# Patient Record
Sex: Male | Born: 1975 | Race: White | Hispanic: No | Marital: Married | Smoking: Current every day smoker
Health system: Southern US, Community
[De-identification: ages and names within clinical notes are randomized; demographics above are authoritative.]

---

## 2008-07-24 ENCOUNTER — Emergency Department (HOSPITAL_COMMUNITY): Admission: EM | Admit: 2008-07-24 | Discharge: 2008-07-24 | Payer: Self-pay | Admitting: Emergency Medicine

## 2011-08-06 LAB — URINALYSIS, ROUTINE W REFLEX MICROSCOPIC
Bilirubin Urine: NEGATIVE
Glucose, UA: NEGATIVE
Hgb urine dipstick: NEGATIVE
Protein, ur: NEGATIVE
Urobilinogen, UA: 0.2

## 2018-11-30 ENCOUNTER — Ambulatory Visit (HOSPITAL_COMMUNITY)
Admission: EM | Admit: 2018-11-30 | Discharge: 2018-11-30 | Disposition: A | Discharge status: Home / Self Care | Payer: Self-pay | Attending: Physician Assistant | Admitting: Physician Assistant

## 2018-11-30 ENCOUNTER — Encounter (HOSPITAL_COMMUNITY): Payer: Self-pay | Admitting: *Deleted

## 2018-11-30 DIAGNOSIS — Z23 Encounter for immunization: Secondary | ICD-10-CM

## 2018-11-30 DIAGNOSIS — S41111A Laceration without foreign body of right upper arm, initial encounter: Secondary | ICD-10-CM | POA: Insufficient documentation

## 2018-11-30 MED ORDER — TETANUS-DIPHTH-ACELL PERTUSSIS 5-2.5-18.5 LF-MCG/0.5 IM SUSP
0.5000 mL | Freq: Once | INTRAMUSCULAR | Status: AC
  Administered 2018-11-30: 0.5 mL via INTRAMUSCULAR

## 2018-11-30 MED ORDER — TETANUS-DIPHTH-ACELL PERTUSSIS 5-2.5-18.5 LF-MCG/0.5 IM SUSP
INTRAMUSCULAR | Status: AC
  Filled 2018-11-30: qty 0.5

## 2018-11-30 MED ORDER — CEPHALEXIN 500 MG PO CAPS: 500.0000 mg | ORAL_CAPSULE | Freq: Two times a day (BID) | ORAL | 0 refills | Status: AC

## 2018-11-30 NOTE — ED Triage Notes (Signed)
Assessment per M. Chestine Spore, PA.
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
## 2018-11-30 NOTE — Discharge Instructions (Signed)
°  Start the antibiotic tonight.  Come back in 10 days for suture removal.

## 2018-12-10 ENCOUNTER — Ambulatory Visit (HOSPITAL_COMMUNITY)
Admission: EM | Admit: 2018-12-10 | Discharge: 2018-12-10 | Disposition: A | Discharge status: Home / Self Care | Payer: Self-pay

## 2018-12-10 ENCOUNTER — Encounter (HOSPITAL_COMMUNITY): Payer: Self-pay | Admitting: Emergency Medicine

## 2018-12-10 DIAGNOSIS — Z4802 Encounter for removal of sutures: Secondary | ICD-10-CM

## 2018-12-10 NOTE — ED Triage Notes (Signed)
Pt here for suture removal to right forearm; 5 sutures removed and wound intact

## 2020-06-02 ENCOUNTER — Ambulatory Visit (HOSPITAL_COMMUNITY): Payer: Self-pay

## 2020-06-13 ENCOUNTER — Ambulatory Visit (HOSPITAL_COMMUNITY): Payer: Self-pay

## 2021-03-26 ENCOUNTER — Emergency Department (HOSPITAL_COMMUNITY): Payer: Self-pay

## 2021-03-26 ENCOUNTER — Emergency Department (HOSPITAL_COMMUNITY)
Admission: EM | Admit: 2021-03-26 | Discharge: 2021-03-27 | Discharge status: Against Medical Advice | Payer: Self-pay | Attending: Student | Admitting: Student

## 2021-03-26 ENCOUNTER — Other Ambulatory Visit: Payer: Self-pay

## 2021-03-26 ENCOUNTER — Encounter (HOSPITAL_COMMUNITY): Payer: Self-pay

## 2021-03-26 DIAGNOSIS — X58XXXA Exposure to other specified factors, initial encounter: Secondary | ICD-10-CM | POA: Insufficient documentation

## 2021-03-26 DIAGNOSIS — Z5321 Procedure and treatment not carried out due to patient leaving prior to being seen by health care provider: Secondary | ICD-10-CM | POA: Insufficient documentation

## 2021-03-26 DIAGNOSIS — S60351A Superficial foreign body of right thumb, initial encounter: Secondary | ICD-10-CM | POA: Insufficient documentation

## 2021-03-26 MED ORDER — TETANUS-DIPHTH-ACELL PERTUSSIS 5-2.5-18.5 LF-MCG/0.5 IM SUSY: 0.5000 mL | PREFILLED_SYRINGE | Freq: Once | INTRAMUSCULAR | Status: DC

## 2021-03-26 NOTE — ED Notes (Signed)
Called for repeat VS x3, no response. 

## 2021-03-26 NOTE — ED Triage Notes (Signed)
Fish hook stuck on right thumb and unable to take it out. Unknown tetanus.

## 2021-03-26 NOTE — ED Provider Notes (Signed)
Emergency Medicine Provider Triage Evaluation Note  Bradley Alexander , a 45 y.o. male  was evaluated in triage.  Pt complains of right thumb injury. Patient has a hook stuck in right thumb and unable to remove. Unsure when his last tetanus shot was. He attempted to pull it out without any luck. Patient is right hand dominant.  Review of Systems  Positive: wound Negative: fever  Physical Exam  BP (!) 135/91 (BP Location: Left Arm)   Pulse (!) 101   Temp 98.1 F (36.7 C) (Oral)   Ht 5\' 6"  (1.676 m)   Wt 65.8 kg   SpO2 97%   BMI 23.40 kg/m  Gen:   Awake, no distress   Resp:  Normal effort  MSK:   Moves extremities without difficulty  Other:  Hook stuck in right thumb  Medical Decision Making  Medically screening exam initiated at 9:40 PM.  Appropriate orders placed.  Bradley Alexander was informed that the remainder of the evaluation will be completed by another provider, this initial triage assessment does not replace that evaluation, and the importance of remaining in the ED until their evaluation is complete.  X-ray ordered.    Iva Lento 03/26/21 2142    2143, MD 03/27/21 [phone]

## 2021-03-27 ENCOUNTER — Ambulatory Visit: Payer: Self-pay

## 2021-03-27 ENCOUNTER — Ambulatory Visit (HOSPITAL_COMMUNITY)
Admission: EM | Admit: 2021-03-27 | Discharge: 2021-03-27 | Disposition: A | Discharge status: Home / Self Care | Payer: Self-pay | Attending: Internal Medicine | Admitting: Internal Medicine

## 2021-03-27 ENCOUNTER — Encounter (HOSPITAL_COMMUNITY): Payer: Self-pay

## 2021-03-27 DIAGNOSIS — Z23 Encounter for immunization: Secondary | ICD-10-CM

## 2021-03-27 DIAGNOSIS — S60351A Superficial foreign body of right thumb, initial encounter: Secondary | ICD-10-CM

## 2021-03-27 MED ORDER — MUPIROCIN CALCIUM 2 % EX CREA: 1.0000 "application " | TOPICAL_CREAM | Freq: Two times a day (BID) | CUTANEOUS | 0 refills | Status: AC

## 2021-03-27 MED ORDER — TETANUS-DIPHTH-ACELL PERTUSSIS 5-2.5-18.5 LF-MCG/0.5 IM SUSY
0.5000 mL | PREFILLED_SYRINGE | Freq: Once | INTRAMUSCULAR | Status: AC
  Administered 2021-03-27: 0.5 mL via INTRAMUSCULAR

## 2021-03-27 MED ORDER — TETANUS-DIPHTH-ACELL PERTUSSIS 5-2.5-18.5 LF-MCG/0.5 IM SUSY
PREFILLED_SYRINGE | INTRAMUSCULAR | Status: AC
  Filled 2021-03-27: qty 0.5

## 2021-03-27 MED ORDER — LIDOCAINE HCL 2 % IJ SOLN
INTRAMUSCULAR | Status: AC
  Filled 2021-03-27: qty 20

## 2021-03-27 NOTE — ED Provider Notes (Signed)
[facility]    CSN: [id number] Arrival date & time: 03/27/21  1029      History   Chief Complaint Chief Complaint  Patient presents with  . finger problem    HPI Bradley Alexander is a 45 y.o. male comes to the urgent care with painful swelling of the right thumb of 1 day duration.  [place] in his right thumb.  He had x-ray of the right thumb yesterday in the ED but left before provider could evaluate him.  He comes to the urgent care complaining of worsening pain.  He attempted to remove the embedded fishhook unsuccessfully.  Tdap has not been updated in over 10 years. HPI  History reviewed. No pertinent past medical history.  There are no problems to display for this patient.   History reviewed. No pertinent surgical history.     Home Medications    Prior to Admission medications   Medication Sig Start Date End Date Taking? Authorizing Provider  mupirocin cream (BACTROBAN) 2 % Apply 1 application topically 2 (two) times daily. 03/27/21  Yes Thanvi Blincoe, Britta Mccreedy, MD    Family History Family History  Problem Relation Age of Onset  . Diabetes Father     Social History Social History   Tobacco Use  . Smoking status: Current Every Day Smoker  . Smokeless tobacco: Never Used  Vaping Use  . Vaping Use: Never used  Substance Use Topics  . Alcohol use: Not Currently  . Drug use: Never     Allergies   Patient has no known allergies.   Review of Systems Review of Systems  Gastrointestinal: Negative.   Musculoskeletal: Negative.   Skin: Negative.      Physical Exam Triage Vital Signs ED Triage Vitals  Enc Vitals Group     BP 03/27/21 1143 100/75     Pulse Rate 03/27/21 1143 78     Resp 03/27/21 1143 18     Temp 03/27/21 1143 98.9 F (37.2 C)     Temp Source 03/27/21 1143 Oral     SpO2 03/27/21 1143 98 %     Weight --      Height --      Head Circumference --      Peak Flow --      Pain  Score 03/27/21 1141 10     Pain Loc --      Pain Edu? --      Excl. in GC? --    No data found.  Updated Vital Signs BP 100/75 (BP Location: Right Arm)   Pulse 78   Temp 98.9 F (37.2 C) (Oral)   Resp 18   SpO2 98%   Visual Acuity Right Eye Distance:   Left Eye Distance:   Bilateral Distance:    Right Eye Near:   Left Eye Near:    Bilateral Near:     Physical Exam Vitals and nursing note reviewed.  Constitutional:      General: He is not in acute distress.    Appearance: He is not ill-appearing.  Cardiovascular:     Rate and Rhythm: Normal rate and regular rhythm.  Skin:    Capillary Refill: Capillary refill takes less than 2 seconds.     Comments: Fishhook embedded in the pulp of the right thumb.  Neurological:     Mental Status: He is alert.      UC Treatments / Results  Labs (all labs ordered  are listed, but only abnormal results are displayed) Labs Reviewed - No data to display  EKG   Radiology DG Finger Thumb Right  Result Date: 03/26/2021 CLINICAL DATA:  Fishhook stuck in finger. EXAM: RIGHT THUMB 2+V COMPARISON:  None. FINDINGS: There is no evidence of fracture or dislocation. There is no evidence of arthropathy or other focal bone abnormality. Fishhook appears to be stuck in the soft tissues of the distal right thumb, but it does not appear to be imbedded in the bone. IMPRESSION: No definite fracture or dislocation is noted. Fishhook is in the soft tissues of the distal thumb as described above. Electronically Signed   By: Lupita Raider M.D.   On: 03/26/2021 22:42    Procedures Foreign Body Removal  Date/Time: 03/27/2021 1:39 PM Performed by: Merrilee Jansky, MD Authorized by: Merrilee Jansky, MD   Consent:    Consent obtained:  Verbal   Consent given by:  Patient   Risks, benefits, and alternatives were discussed: yes     Risks discussed:  Bleeding and infection Universal protocol:    Patient identity confirmed:  Verbally with  patient Location:    Location:  Finger   Finger location:  R thumb   Tendon involvement:  None Pre-procedure details:    Imaging:  X-ray   Neurovascular status: intact   Procedure details:    Localization method:  Visualized   Removal mechanism:  Hemostat   Foreign bodies recovered:  1 Post-procedure details:    Neurovascular status: intact     Confirmation:  No additional foreign bodies on visualization   Procedure completion:  Tolerated well, no immediate complications   (including critical care time)  Medications Ordered in UC Medications  Tdap (BOOSTRIX) injection 0.5 mL (0.5 mLs Intramuscular Given 03/27/21 1304)    Initial Impression / Assessment and Plan / UC Course  I have reviewed the triage vital signs and the nursing notes.  Pertinent labs & imaging results that were available during my care of the patient were reviewed by me and considered in my medical decision making (see chart for details).    1.  Foreign body in the skin of the right thumb: Foreign body removed Tdap updated Mupirocin cream twice daily Return precautions given Final Clinical Impressions(s) / UC Diagnoses   Final diagnoses:  Foreign body of skin of thumb, right, initial encounter     Discharge Instructions     Please apply mupirocin cream twice daily for the next 5 days If you notice any swelling or redness please return to the urgent care to be reevaluated.   ED Prescriptions    Medication Sig Dispense Auth. Provider   mupirocin cream (BACTROBAN) 2 % Apply 1 application topically 2 (two) times daily. 15 g Nohealani Medinger, Britta Mccreedy, MD     PDMP not reviewed this encounter.   Merrilee Jansky, MD 03/27/21 1340

## 2021-03-27 NOTE — Discharge Instructions (Signed)
Please apply mupirocin cream twice daily for the next 5 days If you notice any swelling or redness please return to the urgent care to be reevaluated.

## 2021-03-27 NOTE — ED Triage Notes (Signed)
Pt presents with pain and swelling in the right thumb x 1 day. States he has a piece of fish hook in the right thumb.   Pt reports he went to the Ed last night and had Xray done and left after 2 hrs waiting.   Pt reports his last Tdap was 10*15 years ago.

## 2021-04-01 ENCOUNTER — Emergency Department (HOSPITAL_COMMUNITY)
Admission: EM | Admit: 2021-04-01 | Discharge: 2021-04-02 | Disposition: A | Discharge status: Home / Self Care | Payer: Self-pay | Attending: Emergency Medicine | Admitting: Emergency Medicine

## 2021-04-01 ENCOUNTER — Emergency Department (HOSPITAL_COMMUNITY): Payer: Self-pay

## 2021-04-01 ENCOUNTER — Other Ambulatory Visit: Payer: Self-pay

## 2021-04-01 ENCOUNTER — Encounter (HOSPITAL_COMMUNITY): Payer: Self-pay

## 2021-04-01 DIAGNOSIS — S01111A Laceration without foreign body of right eyelid and periocular area, initial encounter: Secondary | ICD-10-CM | POA: Insufficient documentation

## 2021-04-01 DIAGNOSIS — T1490XA Injury, unspecified, initial encounter: Secondary | ICD-10-CM

## 2021-04-01 DIAGNOSIS — M25511 Pain in right shoulder: Secondary | ICD-10-CM | POA: Insufficient documentation

## 2021-04-01 DIAGNOSIS — S060X1A Concussion with loss of consciousness of 30 minutes or less, initial encounter: Secondary | ICD-10-CM | POA: Insufficient documentation

## 2021-04-01 DIAGNOSIS — Y92410 Unspecified street and highway as the place of occurrence of the external cause: Secondary | ICD-10-CM | POA: Insufficient documentation

## 2021-04-01 DIAGNOSIS — F10129 Alcohol abuse with intoxication, unspecified: Secondary | ICD-10-CM | POA: Insufficient documentation

## 2021-04-01 LAB — I-STAT CHEM 8, ED
BUN: 7 mg/dL (ref 6–20)
Calcium, Ion: 1.12 mmol/L — ABNORMAL LOW (ref 1.15–1.40)
Chloride: 100 mmol/L (ref 98–111)
Creatinine, Ser: 0.8 mg/dL (ref 0.61–1.24)
Glucose, Bld: 97 mg/dL (ref 70–99)
HCT: 45 % (ref 39.0–52.0)
Hemoglobin: 15.3 g/dL (ref 13.0–17.0)
Potassium: 3.9 mmol/L (ref 3.5–5.1)
Sodium: 139 mmol/L (ref 135–145)
TCO2: 27 mmol/L (ref 22–32)

## 2021-04-01 LAB — SAMPLE TO BLOOD BANK
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
## 2021-04-01 NOTE — ED Notes (Signed)
Pt not wearing c-collar on arrival due to refusal with EMS. Pt advised to have one placed in ED and pt refused stating he is okay. Pt educated and verbalized understanding. Pt appears stable and is AAOx4.

## 2021-04-01 NOTE — ED Notes (Signed)
Patient transported to CT 

## 2021-04-02 ENCOUNTER — Emergency Department (HOSPITAL_COMMUNITY): Payer: Self-pay

## 2021-04-02 LAB — URINALYSIS, ROUTINE W REFLEX MICROSCOPIC
Bilirubin Urine: NEGATIVE
Glucose, UA: NEGATIVE mg/dL
Hgb urine dipstick: NEGATIVE
Ketones, ur: NEGATIVE mg/dL
Leukocytes,Ua: NEGATIVE
Nitrite: NEGATIVE
Protein, ur: NEGATIVE mg/dL
Specific Gravity, Urine: 1.003 — ABNORMAL LOW (ref 1.005–1.030)
pH: 6 (ref 5.0–8.0)

## 2021-04-02 LAB — COMPREHENSIVE METABOLIC PANEL
ALT: 17 U/L (ref 0–44)
AST: 25 U/L (ref 15–41)
Albumin: 3.8 g/dL (ref 3.5–5.0)
Alkaline Phosphatase: 98 U/L (ref 38–126)
Anion gap: 8 (ref 5–15)
BUN: 7 mg/dL (ref 6–20)
CO2: 27 mmol/L (ref 22–32)
Calcium: 8.6 mg/dL — ABNORMAL LOW (ref 8.9–10.3)
Chloride: 100 mmol/L (ref 98–111)
Creatinine, Ser: 0.75 mg/dL (ref 0.61–1.24)
GFR, Estimated: >60 mL/min (ref >60)
Glucose, Bld: 103 mg/dL — ABNORMAL HIGH (ref 70–99)
Potassium: 3.9 mmol/L (ref 3.5–5.1)
Sodium: 135 mmol/L (ref 135–145)
Total Bilirubin: 0.6 mg/dL (ref 0.3–1.2)
Total Protein: 6.6 g/dL (ref 6.5–8.1)

## 2021-04-02 LAB — CBC
HCT: 43.7 % (ref 39.0–52.0)
Hemoglobin: 14.7 g/dL (ref 13.0–17.0)
MCH: 32.9 pg (ref 26.0–34.0)
MCHC: 33.6 g/dL (ref 30.0–36.0)
MCV: 97.8 fL (ref 80.0–100.0)
Platelets: 246 10*3/uL (ref 150–400)
RBC: 4.47 MIL/uL (ref 4.22–5.81)
RDW: 11.9 % (ref 11.5–15.5)
WBC: 12.2 10*3/uL — ABNORMAL HIGH (ref 4.0–10.5)
nRBC: 0 % (ref 0.0–0.2)

## 2021-04-02 LAB — ETHANOL: Alcohol, Ethyl (B): 212 mg/dL — ABNORMAL HIGH (ref <10)
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
## 2021-04-02 NOTE — ED Notes (Signed)
Pt ambulatory to bathroom wth even and steady gait

## 2021-04-02 NOTE — ED Notes (Signed)
Pt back from the restroom and hooked back up. Pt and visitor expressed feelings of wanting to leave AMA.
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
## 2021-04-04 ENCOUNTER — Encounter (HOSPITAL_COMMUNITY): Payer: Self-pay
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
## 2022-10-07 ENCOUNTER — Other Ambulatory Visit: Payer: Self-pay

## 2022-10-07 ENCOUNTER — Emergency Department (HOSPITAL_COMMUNITY)
Admission: EM | Admit: 2022-10-07 | Discharge: 2022-10-07 | Disposition: A | Discharge status: Home / Self Care | Payer: Self-pay | Attending: Emergency Medicine | Admitting: Emergency Medicine

## 2022-10-07 ENCOUNTER — Encounter (HOSPITAL_COMMUNITY): Payer: Self-pay

## 2022-10-07 ENCOUNTER — Emergency Department (HOSPITAL_COMMUNITY): Payer: Self-pay

## 2022-10-07 DIAGNOSIS — S63259A Unspecified dislocation of unspecified finger, initial encounter: Secondary | ICD-10-CM

## 2022-10-07 DIAGNOSIS — S6992XA Unspecified injury of left wrist, hand and finger(s), initial encounter: Secondary | ICD-10-CM | POA: Diagnosis present

## 2022-10-07 DIAGNOSIS — S80211A Abrasion, right knee, initial encounter: Secondary | ICD-10-CM | POA: Insufficient documentation

## 2022-10-07 DIAGNOSIS — S63287A Dislocation of proximal interphalangeal joint of left little finger, initial encounter: Secondary | ICD-10-CM | POA: Insufficient documentation

## 2022-10-07 DIAGNOSIS — S8991XA Unspecified injury of right lower leg, initial encounter: Secondary | ICD-10-CM

## 2022-10-07 MED ORDER — LIDOCAINE HCL (PF) 1 % IJ SOLN
5.0000 mL | Freq: Once | INTRAMUSCULAR | Status: AC
  Administered 2022-10-07: 5 mL via INTRADERMAL
  Filled 2022-10-07: qty 30

## 2022-10-07 MED ORDER — BACITRACIN ZINC 500 UNIT/GM EX OINT
TOPICAL_OINTMENT | CUTANEOUS | Status: AC
  Filled 2022-10-07: qty 2.7

## 2022-10-07 MED ORDER — OXYCODONE-ACETAMINOPHEN 5-325 MG PO TABS
1.0000 | ORAL_TABLET | Freq: Once | ORAL | Status: AC
  Administered 2022-10-07: 1 via ORAL
  Filled 2022-10-07: qty 1

## 2022-10-07 MED ORDER — BUPIVACAINE HCL (PF) 0.5 % IJ SOLN
10.0000 mL | Freq: Once | INTRAMUSCULAR | Status: AC
  Administered 2022-10-07: 10 mL
  Filled 2022-10-07: qty 30
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
## 2022-10-07 NOTE — Discharge Instructions (Signed)
Your x-ray showed a dislocation of your left finger.  We injected numbing medicine into your finger and then were able to straighten out the dislocation.  You should wear the splint for 7 days.  After 7 days you can take off the splint and you should be able to bend and fully straighten out your finger.  If you are having difficulty or unable to do this, you can schedule follow-up appointment with an orthopedic hand surgeon.  Otherwise you do not need to see a specialist.  You can take over-the-counter ibuprofen 600 mg as well as Tylenol 650 mg every 6 hours, as needed for pain.  You can take both of these together as needed.  You can apply over-the-counter bacitracin or Neosporin ointment for the next 5 days to your wound on your right knee.  You can put this on twice a day.  This will help prevent infection.
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
## 2024-01-04 ENCOUNTER — Ambulatory Visit (HOSPITAL_BASED_OUTPATIENT_CLINIC_OR_DEPARTMENT_OTHER)
Admission: RE | Admit: 2024-01-04 | Discharge: 2024-01-04 | Disposition: A | Discharge status: Home / Self Care | Source: Ambulatory Visit | Attending: Internal Medicine | Admitting: Internal Medicine

## 2024-01-04 ENCOUNTER — Other Ambulatory Visit: Payer: Self-pay

## 2024-01-04 ENCOUNTER — Ambulatory Visit
Admission: RE | Admit: 2024-01-04 | Discharge: 2024-01-04 | Disposition: A | Discharge status: Home / Self Care | Payer: Self-pay | Source: Ambulatory Visit | Attending: Internal Medicine | Admitting: Internal Medicine

## 2024-01-04 VITALS — BP 125/76 | HR 109 | Temp 98.4°F | Resp 16

## 2024-01-04 DIAGNOSIS — R509 Fever, unspecified: Secondary | ICD-10-CM | POA: Diagnosis not present

## 2024-01-04 DIAGNOSIS — R059 Cough, unspecified: Secondary | ICD-10-CM | POA: Insufficient documentation

## 2024-01-04 DIAGNOSIS — F17210 Nicotine dependence, cigarettes, uncomplicated: Secondary | ICD-10-CM | POA: Diagnosis not present

## 2024-01-04 DIAGNOSIS — R0602 Shortness of breath: Secondary | ICD-10-CM | POA: Insufficient documentation

## 2024-01-04 DIAGNOSIS — B9689 Other specified bacterial agents as the cause of diseases classified elsewhere: Secondary | ICD-10-CM

## 2024-01-04 DIAGNOSIS — J208 Acute bronchitis due to other specified organisms: Secondary | ICD-10-CM

## 2024-01-04 DIAGNOSIS — F17200 Nicotine dependence, unspecified, uncomplicated: Secondary | ICD-10-CM | POA: Diagnosis not present

## 2024-01-04 MED ORDER — AZITHROMYCIN 250 MG PO TABS: 250.0000 mg | ORAL_TABLET | Freq: Every day | ORAL | 0 refills | Status: AC

## 2024-01-04 MED ORDER — ALBUTEROL SULFATE HFA 108 (90 BASE) MCG/ACT IN AERS
2.0000 | INHALATION_SPRAY | Freq: Once | RESPIRATORY_TRACT | Status: AC
  Administered 2024-01-04: 2 via RESPIRATORY_TRACT

## 2024-01-04 MED ORDER — AEROCHAMBER PLUS FLO-VU MEDIUM MISC
1.0000 | Freq: Once | Status: AC
  Administered 2024-01-04: 1

## 2024-01-04 MED ORDER — PREDNISONE 20 MG PO TABS: 40.0000 mg | ORAL_TABLET | Freq: Every day | ORAL | 0 refills | Status: AC

## 2024-01-04 NOTE — ED Triage Notes (Signed)
 Pt states he has a persistent cough, chills, SOB and fatigue for few weeks.

## 2024-01-04 NOTE — ED Provider Notes (Signed)
 [facility]    CSN: [id number] Arrival date & time: 01/04/24  6440      History   Chief Complaint Chief Complaint  Patient presents with   Cough    Have had this really bad cough for 2 and half weeks - Entered by patient    HPI Bradley Alexander is a 48 y.o. male.   Patient presents to urgent care for evaluation of cough and congestion that started approximately 3 weeks ago.  Cough is productive with green/yellow sputum.  He reports intermittent shortness of breath and chest tightness associated with cough.  He had a fever at the beginning of illness which then improved for a couple of weeks, however fever has returned.  His wife did not take his temperature at home but reports tactile temp and patient has been experiencing chills/sweats for the last 2-3 nights.  He is a current everyday cigarette smoker, denies other drug use.  Denies history of chronic respiratory problems.  Denies nausea, vomiting, diarrhea, abdominal pain, rash, headache, dizziness, leg swelling, orthopnea, and recent antibiotic or steroid use.  He has been taking over-the-counter medications with no relief.   Cough   History reviewed. No pertinent past medical history.  There are no active problems to display for this patient.   History reviewed. No pertinent surgical history.     Home Medications    Prior to Admission medications   Medication Sig Start Date End Date Taking? Authorizing Provider  azithromycin (ZITHROMAX) 250 MG tablet Take 1 tablet (250 mg total) by mouth daily. Take first 2 tablets together, then 1 every day until finished. 01/04/24  Yes Carlisle Beers, FNP  predniSONE (DELTASONE) 20 MG tablet Take 2 tablets (40 mg total) by mouth daily with breakfast for 5 days. 01/04/24 01/09/24 Yes StanhopeDonavan Burnet, FNP  mupirocin cream (BACTROBAN) 2 % Apply 1 application topically 2 (two) times daily. 03/27/21   Lamptey, Britta Mccreedy, MD    Family History Family History  Problem Relation  Age of Onset   Diabetes Father     Social History Social History   Tobacco Use   Smoking status: Every Day    Current packs/day: 1.00    Types: Cigarettes   Smokeless tobacco: Never  Vaping Use   Vaping status: Some Days   Substances: Nicotine, Flavoring  Substance Use Topics   Alcohol use: Yes   Drug use: Never     Allergies   Patient has no known allergies.   Review of Systems Review of Systems  Respiratory:  Positive for cough.   Per HPI   Physical Exam Triage Vital Signs ED Triage Vitals  Encounter Vitals Group     BP 01/04/24 0830 125/76     Systolic BP Percentile --      Diastolic BP Percentile --      Pulse Rate 01/04/24 0830 (!) 109     Resp 01/04/24 0830 16     Temp 01/04/24 0830 98.4 F (36.9 C)     Temp Source 01/04/24 0830 Oral     SpO2 01/04/24 0830 96 %     Weight --      Height --      Head Circumference --      Peak Flow --      Pain Score 01/04/24 0831 8     Pain Loc --      Pain Education --      Exclude from Growth Chart --    No data found.  Updated Vital Signs BP 125/76 (BP Location: Right Arm)   Pulse (!) 109   Temp 98.4 F (36.9 C) (Oral)   Resp 16   SpO2 96%   Visual Acuity Right Eye Distance:   Left Eye Distance:   Bilateral Distance:    Right Eye Near:   Left Eye Near:    Bilateral Near:     Physical Exam Vitals and nursing note reviewed.  Constitutional:      Appearance: He is not ill-appearing or toxic-appearing.  HENT:     Head: Normocephalic and atraumatic.     Right Ear: Hearing, tympanic membrane, ear canal and external ear normal.     Left Ear: Hearing, tympanic membrane, ear canal and external ear normal.     Nose: Congestion present.     Mouth/Throat:     Lips: Pink.     Mouth: Mucous membranes are moist. No injury or oral lesions.     Dentition: Normal dentition.     Tongue: No lesions.     Pharynx: Oropharynx is clear. Uvula midline. No pharyngeal swelling, oropharyngeal exudate, posterior  oropharyngeal erythema, uvula swelling or postnasal drip.     Tonsils: No tonsillar exudate.  Eyes:     General: Lids are normal. Vision grossly intact. Gaze aligned appropriately.     Extraocular Movements: Extraocular movements intact.     Conjunctiva/sclera: Conjunctivae normal.  Neck:     Trachea: Trachea and phonation normal.  Cardiovascular:     Rate and Rhythm: Normal rate and regular rhythm.     Heart sounds: Normal heart sounds, S1 normal and S2 normal.  Pulmonary:     Effort: Pulmonary effort is normal. No respiratory distress.     Breath sounds: Normal air entry. No wheezing, rhonchi or rales.     Comments: Coarse breath sounds throughout.  Harsh cough elicited with deep inspiration.  No focal lung abnormality to auscultation.  Speaking in full sentences without difficulty. Chest:     Chest wall: No tenderness.  Musculoskeletal:     Cervical back: Neck supple.     Right lower leg: No edema.     Left lower leg: No edema.  Lymphadenopathy:     Cervical: Cervical adenopathy present.  Skin:    General: Skin is warm and dry.     Capillary Refill: Capillary refill takes less than 2 seconds.     Findings: No rash.  Neurological:     General: No focal deficit present.     Mental Status: He is alert and oriented to person, place, and time. Mental status is at baseline.     Cranial Nerves: No dysarthria or facial asymmetry.  Psychiatric:        Mood and Affect: Mood normal.        Speech: Speech normal.        Behavior: Behavior normal.        Thought Content: Thought content normal.        Judgment: Judgment normal.      UC Treatments / Results  Labs (all labs ordered are listed, but only abnormal results are displayed) Labs Reviewed - No data to display  EKG   Radiology No results found.  Procedures Procedures (including critical care time)  Medications Ordered in UC Medications  albuterol (VENTOLIN HFA) 108 (90 Base) MCG/ACT inhaler 2 puff (2 puffs  Inhalation Given 01/04/24 0851)  AeroChamber Plus Flo-Vu Medium MISC 1 each (1 each Other Given 01/04/24 0851)    Initial Impression / Assessment  and Plan / UC Course  I have reviewed the triage vital signs and the nursing notes.  Pertinent labs & imaging results that were available during my care of the patient were reviewed by me and considered in my medical decision making (see chart for details).   1.  Acute bacterial bronchitis, cigarette nicotine dependence without complication Presentation suspicious for acute bacterial bronchitis, however I would like to obtain a chest x-ray to rule out focal consolidation/walking pneumonia.  We unfortunately do not have x-ray at this location today and therefore outpatient x-ray has been ordered for med [facility]. Staff will call patient if x-ray results show need for change in treatment plan.  Will treat with azithromycin, prednisone burst, and albuterol inhaler as needed for cough, shortness of breath, and wheeze. Low suspicion for pneumothorax/pulmonary embolism. He is low risk based on [place] pulmonary embolism criteria for PE.  Recommend close follow-up with primary care as needed for ongoing evaluation should symptoms fail to improve with above interventions.  Counseled patient on potential for adverse effects with medications prescribed/recommended today, strict ER and return-to-clinic precautions discussed, patient verbalized understanding.    Final Clinical Impressions(s) / UC Diagnoses   Final diagnoses:  Acute bacterial bronchitis  Cigarette nicotine dependence without complication     Discharge Instructions      Please go to med [facility] and have x-ray performed. Do not check in to the ER. Go to imaging department, get images performed, then go home. You will receive a phone call if the x-ray shows any abnormal results requiring further treatment. If the x-ray results do not change our treatment plan, you will not  receive a phone call.  Also see these results on MyChart.  Med [facility] [address] [place], [place]  You have bronchitis which is inflammation of the upper airways in your lungs due to a virus.   We will treat this with steroids and an antibiotic. If the chest x-ray shows pneumonia, I will send in another antibiotic. Do not take any NSAIDs with steroid pills (no ibuprofen, naproxen while taking steroid, this could cause stomach upset).   Use albuterol every 4-6 hours as needed for cough, shortness of breath, and wheezing.   Use guaifenesin (plain mucinex) to break up congestion in nose/chest so that you are able to excrete easier. Drink plenty of fluids to stay well hydrated while taking mucinex so that it works well in the body.   If you develop any new or worsening symptoms or if your symptoms do not start to improve, please return here or follow-up with your primary care provider. If your symptoms are severe, please go to the emergency room.    ED Prescriptions     Medication Sig Dispense Auth. Provider   predniSONE (DELTASONE) 20 MG tablet Take 2 tablets (40 mg total) by mouth daily with breakfast for 5 days. 10 tablet Carlisle Beers, FNP   azithromycin (ZITHROMAX) 250 MG tablet Take 1 tablet (250 mg total) by mouth daily. Take first 2 tablets together, then 1 every day until finished. 6 tablet Carlisle Beers, FNP      PDMP not reviewed this encounter.   Carlisle Beers, FNP 01/04/24 0900

## 2024-01-04 NOTE — Discharge Instructions (Signed)
 Please go to med [facility] and have x-ray performed. Do not check in to the ER. Go to imaging department, get images performed, then go home. You will receive a phone call if the x-ray shows any abnormal results requiring further treatment. If the x-ray results do not change our treatment plan, you will not receive a phone call.  Also see these results on MyChart.  Med [facility] [address] [place], [place]  You have bronchitis which is inflammation of the upper airways in your lungs due to a virus.   We will treat this with steroids and an antibiotic. If the chest x-ray shows pneumonia, I will send in another antibiotic. Do not take any NSAIDs with steroid pills (no ibuprofen, naproxen while taking steroid, this could cause stomach upset).   Use albuterol every 4-6 hours as needed for cough, shortness of breath, and wheezing.   Use guaifenesin (plain mucinex) to break up congestion in nose/chest so that you are able to excrete easier. Drink plenty of fluids to stay well hydrated while taking mucinex so that it works well in the body.   If you develop any new or worsening symptoms or if your symptoms do not start to improve, please return here or follow-up with your primary care provider. If your symptoms are severe, please go to the emergency room.

## 2024-07-18 ENCOUNTER — Other Ambulatory Visit: Payer: Self-pay

## 2024-07-18 ENCOUNTER — Encounter (HOSPITAL_COMMUNITY): Payer: Self-pay

## 2024-07-18 ENCOUNTER — Emergency Department (HOSPITAL_COMMUNITY)
Admission: EM | Admit: 2024-07-18 | Discharge: 2024-07-18 | Disposition: A | Discharge status: Home / Self Care | Payer: Self-pay | Attending: Emergency Medicine | Admitting: Emergency Medicine

## 2024-07-18 DIAGNOSIS — T40601A Poisoning by unspecified narcotics, accidental (unintentional), initial encounter: Secondary | ICD-10-CM | POA: Insufficient documentation

## 2024-07-18 MED ORDER — NALOXONE HCL 4 MG/0.1ML NA LIQD: 1.0000 | Freq: Once | NASAL | 0 refills | Status: AC
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
## 2024-07-18 NOTE — ED Provider Notes (Signed)
 [place] EMERGENCY DEPARTMENT AT [facility] Provider Note   CSN: [id number] Arrival date & time: 07/18/24  1124     Patient presents with: Drug Overdose   Bradley Alexander is a 48 y.o. male.  He is here for evaluation after a probable overdose.  He said he snorted fentanyl.  Family found him unresponsive and gave him 3 doses of Narcan .  They attempted some CPR.  Please got there they gave him 2 more doses of Narcan  and since then he has been awake and alert.  He denies any medical complaints.  No chest pain or shortness of breath.   The history is provided by the patient.  Drug Overdose This is a new problem. The current episode started 1 to 2 hours ago. The problem has been resolved. Pertinent negatives include no chest pain, no abdominal pain, no headaches and no shortness of breath. Nothing aggravates the symptoms. Relieved by: narcan .       Prior to Admission medications   Medication Sig Start Date End Date Taking? Authorizing Provider  azithromycin  (ZITHROMAX ) 250 MG tablet Take 1 tablet (250 mg total) by mouth daily. Take first 2 tablets together, then 1 every day until finished. 01/04/24   Enedelia Dorna HERO, FNP  mupirocin  cream (BACTROBAN ) 2 % Apply 1 application topically 2 (two) times daily. 03/27/21   Lamptey, Aleene KIDD, MD    Allergies: Patient has no known allergies.    Review of Systems  Constitutional:  Negative for fever.  Eyes:  Negative for visual disturbance.  Respiratory:  Negative for shortness of breath.   Cardiovascular:  Negative for chest pain.  Gastrointestinal:  Negative for abdominal pain.  Neurological:  Negative for headaches.    Updated Vital Signs BP (!) 117/94   Pulse (!) 110   Temp 97.8 F (36.6 C)   Resp 14   Ht 5' 4 (1.626 m)   Wt 63.5 kg   SpO2 95%   BMI 24.03 kg/m   Physical Exam Vitals and nursing note reviewed.  Constitutional:      General: He is not in acute distress.    Appearance: Normal appearance. He is  well-developed.  HENT:     Head: Normocephalic and atraumatic.  Eyes:     Conjunctiva/sclera: Conjunctivae normal.  Cardiovascular:     Rate and Rhythm: Regular rhythm. Tachycardia present.     Heart sounds: No murmur heard. Pulmonary:     Effort: Pulmonary effort is normal. No respiratory distress.     Breath sounds: Normal breath sounds.  Abdominal:     Palpations: Abdomen is soft.     Tenderness: There is no abdominal tenderness. There is no guarding or rebound.  Musculoskeletal:        General: No deformity.     Cervical back: Neck supple.  Skin:    General: Skin is warm and dry.     Capillary Refill: Capillary refill takes less than 2 seconds.  Neurological:     General: No focal deficit present.     Mental Status: He is alert.     Gait: Gait normal.  Psychiatric:        Mood and Affect: Mood normal.     (all labs ordered are listed, but only abnormal results are displayed) Labs Reviewed - No data to display  EKG: None  Radiology: No results found.   Procedures   Medications Ordered in the ED - No data to display  Clinical Course as of 07/18/24 1651  Sat  Jul 18, 2024  1214 Patient agreeable to period of observation in the department.  He said he is not interested in any resources for drug use. [MB]  1310 Patient is almost 2 hours out from dose of Narcan  is awake and alert.  He does not want to stay any longer.  He is with some reliable friends though so I think it is reasonable that we discharged him. [MB]    Clinical Course User Index [MB] Towana Ozell BROCKS, MD                                 Medical Decision Making  This patient complains of fentanyl overdose unresponsive; this involves an extensive number of treatment Options and is a complaint that carries with it a high risk of complications and morbidity. The differential includes overdose, rib fractures, pulmonary edema  Additional history obtained from patient's companion Previous records  obtained and reviewed in epic no recent admissions  Cardiac monitoring reviewed, sinus rhythm Social determinants considered, tobacco use Critical Interventions: None  After the interventions stated above, I reevaluated the patient and found patient remains asymptomatic and alert Admission and further testing considered, no indication for admission or further workup at this time.  Given resources for outpatient detox and prescription for Narcan .  Return instructions discussed      Final diagnoses:  Opiate overdose, accidental or unintentional, initial encounter [facility])    ED Discharge Orders          Ordered    naloxone  (NARCAN ) nasal spray 4 mg/0.1 mL   Once        07/18/24 1215               Towana Ozell BROCKS, MD 07/18/24 1652
